# Patient Record
Sex: Male | Born: 2000 | Race: White | Hispanic: Yes | Marital: Single | Smoking: Never smoker
Health system: Southern US, Community
[De-identification: ages and names within clinical notes are randomized; demographics above are authoritative.]

## PROBLEM LIST (undated history)

## (undated) DIAGNOSIS — F840 Autistic disorder: Secondary | ICD-10-CM

## (undated) DIAGNOSIS — K529 Noninfective gastroenteritis and colitis, unspecified: Secondary | ICD-10-CM

## (undated) DIAGNOSIS — T7840XA Allergy, unspecified, initial encounter: Secondary | ICD-10-CM

## (undated) DIAGNOSIS — L309 Dermatitis, unspecified: Secondary | ICD-10-CM

## (undated) HISTORY — DX: Autistic disorder: F84.0

## (undated) HISTORY — PX: CIRCUMCISION: SHX1350

## (undated) HISTORY — PX: ESOPHAGOGASTRODUODENOSCOPY ENDOSCOPY: SHX5814

---
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
## 2021-06-04 ENCOUNTER — Encounter: Payer: Self-pay | Admitting: Psychology

## 2021-06-04 ENCOUNTER — Ambulatory Visit (INDEPENDENT_AMBULATORY_CARE_PROVIDER_SITE_OTHER): Payer: 59 | Admitting: Psychology

## 2021-06-04 DIAGNOSIS — F840 Autistic disorder: Secondary | ICD-10-CM

## 2021-06-04 NOTE — Progress Notes (Signed)
Behavioral Health Counselor Initial Adult Exam  Name: Timothy [name] Date: 06/04/2021 MRN: [id number] DOB: 07/14/2000 PCP: Cooper, Alan, MD  Time spent: 9-9:45 am  Guardian/Informant:  Maggie Surace - Mother    Paperwork requested: No   Met with patient and mother for update interview.  Patient and mother were at home and session was conducted from therapist's office via video conferencing.  Patient and mother verbally consented to telehealth.      Reason for Visit /Presenting Problem: Updated evaluation needed for continuation of support services. 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   Mental Status Exam: Appearance:   Fairly Groomed and Neat     Behavior:  Appropriate  Motor:  Restlestness  Speech/Language:   Clear and Coherent and odd vocal tone  Affect:  Flat  Mood:  euthymic  Thought process:  concrete  Thought content:    WNL  Sensory/Perceptual disturbances:    WNL  Orientation:  oriented to person, place, and time/date  Attention:  Good  Concentration:  Good  Memory:  Remote;   Poor  Fund of knowledge:   Poor  Insight:    Poor  Judgment:   Fair  Impulse Control:  Fair   Developmental History: Little change in development since the previous evaluation.   Developmental history was reported to be significant for early delays.  There were no birth complications or early physical delays.  Timothy [name] was delayed with speech and diagnosed with Autism at 20 months of age.  He communicated via sign language and PECS until he received an iPad for augmentative communication.  Gross motor skills are currently adequately developed.  Although Timothy [name] exercises and takes Tae Kwon Do lessons, he has trouble with motor planning and is physically awkward.  He doesn't play sports.  Fine motor skills are adequate overall.  Timothy [name] has trouble using his nondominant hand for most activities but plays the piano well.  Timothy [name] can speak clearly now, but his language use  is delayed.  Regarding self-care skills, Timothy [name] can do some basic cooking and laundry but needs his clothes folded.  He is able to bathe and dress himself but lacks awareness to do chores or community activities independently.  Timothy [name] lacks self-direction, safety, and advocacy skills as well as community awareness.  Socially, Timothy [name] lacks social interest and does not discriminate between friends and non-friends.  He relies on others to interact safely.  Timothy [name] is generally in a positive mood but only communicates when wants something.        Reported Symptoms:  sleeps 5-6 hours.  Takes Melatonin 10 mg. But still has trouble falling asleep and waking up early.  Will wake in the middle of the night to play electronic games.  No changes in appetite.  Mother monitors his food otherwise he would eat excessive junk food.  Support staff letting him eat too much junk food when out and he has put on some weight.  Energy is good during the day.  Exercises during the day and cooperates with all activities parents ask him to do.  No prolonged sadness or depression.  Has anxiety but no panic.  Triggered by communication difficulty and knowing what is going to be happening.  Make noises when anxious and starts scripting.  Obsessed with baby einstein and classical music (fixated on these).  Compulsively watches the same videos repeatedly.  Difficulty paying attention, easily distracted, frequent losing and forgetting, poor organization - parents handle all of his organizational needs.  Constant restlessness   with frequent skin picking.  No interest in interacting with peers.  Has one other peer with special needs at his dance class who also likes to talk about Timothy [name].  Can transition with prior notice but can become anxious depending upon the type of change.  Has a sensory room in his house and can access items when needed.  Sensitive to noise (wears headphones for this).       Risk Assessment: Danger to Self:   No Self-injurious Behavior: Yes.  without intent/plan skin picking when anxious. Danger to Others: No Duty to Warn:no Physical Aggression / Violence:No  Access to Firearms a concern: No  Gang Involvement:No  Patient / guardian was educated about steps to take if suicide or homicide risk level increases between visits: n/a While future psychiatric events cannot be accurately predicted, the patient does not currently require acute inpatient psychiatric care and does not currently meet [facility] involuntary commitment criteria.  Substance Abuse History: Current substance abuse: No     Past Psychiatric History:   Previous psychological history is significant for autism and intellectual disability. Outpatient Providers:None - mother primary provider  History of Psych Hospitalization: No  Psychological Testing: Autism Spectrum:  ADOS-2 Most recently evaluated by this provider during April 2022.  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  Abuse History:  Victim of: No.,  None    Report needed: No. Victim of Neglect:No. Perpetrator of  None   Witness / Exposure to Domestic Violence: No   Protective Services Involvement: No  Witness to [employer] Violence:  No   Family History:  Family History  Problem Relation Age of Onset   Migraines Mother    Autism Brother    ADD / ADHD Brother    Depression Brother    Anxiety disorder Brother    Tourette syndrome Brother    Sleep disorder Brother    Bipolar disorder Cousin     Living situation: the patient lives with their family (parents and brother - 6).  Brother will be graduating high school this month and leaving for college in the fall. Does seek out to engage family members.  Content to be in room alone with iPad or in sensory room.  Relies on parents for community exposure.    Sexual Orientation: Asexual - no interest in social or intimate relationships.  Relationship Status: single  Name of spouse / other:None If a  parent, number of children / ages:None  Support Systems: parents [employer] waiver, autism society - one assistant for three days per week. Would receive more if not short staff.   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    Military Service: No   Educational History: Education:  Home-school - will stay in school until 22.  Can read close to grade level but comprehension at 1st - 2nd grade.  Spells well and math is 5th -6th grade level.    Religion/Sprituality/World View: Jewish  Any cultural differences that may affect / interfere with treatment:  None  Recreation/Hobbies: Watching videos ([name], baby einstein) and listening to classical music.   Stressors: Other: When not able to go to a place or event that he wants.      Strengths: Likes to please others, puts in good effort,, persistence.  Barriers:  Access to services - staff shortages.     Legal History: Pending legal issue / charges: The patient has no significant history of legal issues. History of legal issue / charges:  None  Medical History/Surgical History: reviewed Past Medical History:  Diagnosis Date   Allergy    seasonal    Autism    Eczema    Inflammatory bowel disease    Chronic Constipation    Past Surgical History:  Procedure Laterality Date   CIRCUMCISION     ESOPHAGOGASTRODUODENOSCOPY ENDOSCOPY     Performed at [facility]/A 05/06/2016   Procedure: EXTRACTION OF IMPACTED 3RD MOLARS, FOUR FIRST BICUSPIDS;  Surgeon: Jannette Fogo, DDS;  Location: [facility];  Service: Oral Surgery;  Laterality: N/A;    Medications: Current Outpatient Medications  Medication Sig Dispense Refill   amoxicillin (AMOXIL) 500 MG capsule Take 1 capsule (500 mg total) by mouth 3 (three)  times daily. 21 capsule 0   cholecalciferol (VITAMIN D) 1000 units tablet Take 1,000 Units by mouth daily.     Cholecalciferol (VITAMIN D) 2000 units CAPS Take by mouth.     fluticasone (FLONASE) 50 MCG/ACT nasal spray Place 2 sprays into the nose daily as needed. Reported on 01/25/2015     HYDROcodone-acetaminophen (NORCO) 5-325 MG tablet Take 1 tablet by mouth every 6 (six) hours as needed for moderate pain. 30 tablet 0   hydrocortisone 2.5 % cream Apply 1 application topically 2 (two) times daily as needed. Reported on 01/25/2015     Lactobacillus-Inulin ([place]) CHEW Chew by mouth. Reported on 01/25/2015     loratadine (RA LORATADINE) 10 MG dissolvable tablet Take 10 mg by mouth daily as needed. Reported on 01/25/2015     Melatonin 5 MG CHEW Chew 10 mg by mouth at bedtime.     Phosphatidylserine-DHA-EPA (VAYARIN PO) Take 2 tablets by mouth daily.     triamcinolone cream (KENALOG) 0.1 % Reported on 01/25/2015     No current facility-administered medications for this visit.    Allergies  Allergen Reactions   Other     Seasonal Allergies, tomato, potato, nuts   No concussions seizures or HI.  Diagnoses:  Autism spectrum disorder  Plan of Care: Patient presents with severe impairment in cognitive, adaptive, and socialization skills with significant restricted repetitive behavior.  Patient was previously diagnosed with Autism spectrum disorder and intellectual disability and needs updated evaluation in order to receive adult community support services.  Testing recommended to update current functioning.      Test Battery - In person WAIS-IV, BRIEF-A Informant, [place] 3, ADOS-2 Module 4, SRS-2-O    [name], PhD

## 2021-06-04 NOTE — Progress Notes (Signed)
                Merrie Epler, PhD 

## 2021-06-12 ENCOUNTER — Ambulatory Visit (INDEPENDENT_AMBULATORY_CARE_PROVIDER_SITE_OTHER): Payer: 59 | Admitting: Psychology

## 2021-06-12 ENCOUNTER — Encounter: Payer: Self-pay | Admitting: Psychology

## 2021-06-12 DIAGNOSIS — F840 Autistic disorder: Secondary | ICD-10-CM

## 2021-06-12 NOTE — Progress Notes (Signed)
[facility] Testing Progress Note  Patient ID: Timothy [name], MRN: [id number],    Date: 06/12/2021  Time Spent: 12:00 - 3:00pm   Treatment Type: Testing  Met with patient and mother for testing session.  Patient and mother were at the clinic and session was conducted from therapist's office in person.  Reported Symptoms: Reason for Visit /Presenting Problem: Patient presents with severe impairment in cognitive, adaptive, and socialization skills with significant restricted repetitive behavior.  Patient was previously diagnosed with Autism spectrum disorder and intellectual disability and needs updated evaluation in order to receive adult community support services.  Testing recommended to update current functioning.        Mental Status Exam: Appearance:  Neat and Adequately Groomed     Behavior: Cooperative and echolalia  Motor: Rocking and odd arm movement  Speech/Language:  Repetitive and odd with atypical inflection  Affect: Flat  Mood: Euthymic with intermittent anxiety  Thought process: Limited other than rote answers  Thought content:   WNL  Sensory/Perceptual disturbances:   WNL  Orientation: oriented to person, place, time/date,   Attention: Fair  Concentration: Fair  Memory: [place] of knowledge:  Fair  Insight:   Poor  Judgment:  Fair  Impulse Control: Good   Risk Assessment: Danger to Self:  No Self-injurious Behavior: No Danger to Others: No  Behavior Observations: Patient was cooperative and displayed adequate effort. Attention and concentration were inconsistent overall, as patient sometimes needed instructions to be repeated,  occasionally solved problems correctly after the standardized time limit and missed several relatively easy problems or questions.  Mood was euthymic with intermittent anxiety and flat affect.  Patient did not initiate interaction or requests and struggled to answer questions requiring insight or inferential thinking.   The results appear representative of current functioning.    Subjective: Testing included the WAIS-IV (1.25 hrs. for testing and scoring) along with the ADOS-2 Module 4 (1.25 hrs.).  Mother completed the [place] 3 and SRS-2 (0.5 hrs. For scoring).     Diagnosis:Autism spectrum disorder  Plan: Testing complete. Report writing to be conducted followed by  interactive feedback next session.    [name], PhD

## 2021-06-12 NOTE — Progress Notes (Signed)
                Haze Antillon, PhD 

## 2021-07-04 ENCOUNTER — Encounter: Payer: Self-pay | Admitting: Psychology

## 2021-07-14 ENCOUNTER — Ambulatory Visit: Payer: 59 | Admitting: Psychology

## 2021-09-19 ENCOUNTER — Ambulatory Visit (INDEPENDENT_AMBULATORY_CARE_PROVIDER_SITE_OTHER): Payer: 59 | Admitting: Psychology

## 2021-09-19 ENCOUNTER — Encounter: Payer: Self-pay | Admitting: Psychology

## 2021-09-19 DIAGNOSIS — F840 Autistic disorder: Secondary | ICD-10-CM | POA: Diagnosis not present

## 2021-09-19 NOTE — Progress Notes (Signed)
[facility] Counselor/Therapist Progress Note  Patient ID: JAKAIDEN FILL, MRN: [id number],    Date: 09/19/2021  Time Spent: 8-8:45 am   Treatment Type:  Testing - Feedback Session  Met with mother to review results of testing.  Mother was at home and session was conducted from therapist's office via video conferencing.  Mother verbally consented to telehealth.       Reported Symptoms: Patient presents with severe impairment in cognitive, adaptive, and socialization skills with significant restricted repetitive behavior.  Patient was previously diagnosed with Autism spectrum disorder and intellectual disability and needs updated evaluation in order to receive adult community support services.  Testing recommended to update current functioning.   Subjective: Interactive feedback was conducted (1 hr.).  It was discussed how patient continued to meet the criterion for Autism Spectrum Disorder along with how this conditions affect his ability to act independently and relate to others.      Recommendations included discussing results with PCP, developing a visual organization system, and continuing current community supports.  Mother expressed agreement with the results and recommendations.     Total Time of Testing: 6 hrs. Testing and Scoring: 3 hrs. Interactive Feedback:1 hr. Report Writing: 2 hrs.   Diagnosis:Autism spectrum disorder  Plan: Report to be sent to parent and referring provider.      [name], PhD

## 2021-09-19 NOTE — Progress Notes (Signed)
Psychological Testing Report - Confidential  Identifying Information:                Patient's Name:   Timothy [name]  Date of Birth:              Jul 11, 2000     Age:     21 years             MRN#                                    [id number]             Dates of Testing:               June 12, 2021    Psychiatric/Psychological Consult Reply:  The limits of confidentiality were discussed with Timothy [name] and his mother Timothy [name].  Sang verbally indicated his assent, and his mother indicated her understanding and agreement with these limits based on her signature on the Limits of Confidentiality Statement.   Purpose of Evaluation:  Timothy [name] was a 21 year old Caucasian male.  The purpose of the evaluation is to provide diagnostic information and treatment recommendations.     Relevant Background Information: Timothy [name] was referred for psychological testing by Timothy Housekeeper, MD for re-evaluation related to Autism Spectrum Disorder (ASD).  Timothy [name] has been previously diagnosed with ASD an update is needed to continue adult community supports/services he is currently receiving.  According to [facility] mother, Timothy [name] is not able to work, drive, Timothy [name], and use public transportation independently.  He can answer questions correctly when asked about self-care but not able to do what he says without guidance.  Timothy [name] recently eloped when he was visiting an unfamiliar college campus with his family, despite telling his family he understood the importance of staying together.  He lacks self-direction, safety skills, and community awareness.     Developmental history was reported to be significant for early delays.  There were no birth complications or early physical delays.  Timothy [name] was delayed with speech and diagnosed with Autism at 65 months of age.  He communicated via sign language and PECS until he received an iPad for augmentative communication.  Gross motor skills are  currently adequately developed.  Although Timothy [name] exercises and takes Timothy [name], he has trouble with motor planning and is physically awkward.  He doesn't play sports.  Fine motor skills are adequate overall.  [name] has trouble using his nondominant hand for most activities but plays the piano well.  Timothy [name] can speak clearly now, but his language use is delayed.  Regarding self-care skills, Timothy [name] can do some basic cooking and laundry but needs his clothes folded.  He is able to bathe and dress himself but lacks awareness to do chores or community activities independently.  Timothy [name] lacks self-direction, safety, and advocacy skills as well as community awareness.  Socially, Timothy [name] lacks social interest and does not discriminate between friends and non-friends.  He relies on others to interact safely.  Timothy [name] is generally in a positive mood but only communicates when wants something.                 Medical history was reported to be significant for Seasonal Allergy , Autism, Eczema, Inflammatory bowel disease, and Chronic Constipation.  Additional allergies include tomatoes, potatoes, and nuts.  Past Surgical History includes circumcision, esophagogastroduodenoscopy endoscopy, and tooth extraction.  Current  Medications Include amoxicillin (Amoxil) 500 mg, cholecalciferol (vitamin d) 2000 units, fluticasone (Flonase) 50 mcg/act nasal spray, hydrocodone-acetaminophen (Norco), hydrocortisone 2.5 % cream, lactobacillus-inulin (Culturelle digestive health) chew, loratadine (Loratadine) 10 mg., melatonin 5 mg.  phosphatidylserine-DHA-EPA (Vayarin po), and triamcinolone cream (Kenalog) 0.1 %.  Psychotropic medications were previously tried for anxiety with little benefit. Timothy [name] was most recently evaluated by this provider during April 2022 and diagnosed with ASD, borderline IQ, severe adaptive delays, and language impairment.  He has not participated in formal behavior or ABA Therapy, but his mother  works with him at home regarding behavior management and skill training.      Educationally, Timothy [name] is currently homeschooled in the 12th grade.  He will stay in school until age 58.  Timothy [name] can read close to grade level, but comprehension is at the 1st - 2nd grade level.  He spells well and Math is at the 5th -6th grade level.  All skills are delayed but Timothy [name] enjoys music.  He likes to make others happy and will work for praise/positive attention.   Occupationally, Timothy [name] is supported by family and friends. 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   Regarding leisure activities, Timothy [name] reported enjoying watching videos (Timothy [name]) and listening to classical music.  Timothy [name] needs constant direct supervision to do home and community activities safely.   Timothy [name] lives with his parents and brother (18 years) who also has Autism but is not as impaired as Timothy [name].  Timothy [name] doesn't interact much with his brother, but they are nice to each other.  Extended family lives in [place]. Timothy [name] will visit with them but not regularly.   Timothy [name] does not have a dating history.  Family mental health/learning history was reported to be significant for his brother with Autism, ADHD, Depression, Anxiety, Tourette's Syndrome, and a sleep disorder, as well as a cousin with Bipolar Disorder.  Recent changes/life events were denied, although Timothy [name] becomes stressed when he is not able to go to a place or event that he wants. A lack of access to services due to staff shortages has been a recent barrier for him.     Presenting Symptomology:  Timothy [name] was reported to sleep 5-6 hours per night.  He takes Melatonin but still has trouble falling asleep and waking up early.  He will wake in the middle of the night to play electronic games.  Recent changes in appetite were denied.  Timothy [name]'s mother  monitors his food otherwise he would eat excessive amounts of junk food.  She reported that Raphel's support staff is letting him eat too much junk food when out in the community and he has put on some weight.  Energy was reported to be good during the day.  Cohl exercises during the day and cooperates with all activities his parents ask him to do.  Oluwafemi denied episodes of prolonged sadness or depression.  He has anxiety but no panic.  The anxiety is triggered by communication difficulty and not knowing what is going to be happening.  He makes noises when anxious and starts scripting.  Timothy [name] was reported to be obsessed with watching [employer] and listening to classical music.  He compulsively watches the same videos repeatedly.  Jamez was reported to have difficulty paying attention, being easily distracted, and frequently losing items or forgetting important information. He has  poor organization, and his parents handle all his organizational needs.  He exhibits constant restlessness with frequent skin picking.  Jaydien exhibits no interest in interacting with peers.  He has one other peer with special needs at his dance class who also likes to talk about Patrice Paradise.  He can transition with prior notice but can become anxious depending upon the type of change.  Timothy [name] has a sensory room in his house and can access items when needed.  he is overly sensitive to noise and wears headphones to limit surrounding sounds.       Procedures Administered: Wechsler Adult 7  20 Client: Julie Paolini           DOB:  [birth year]          MRN# [id number] [facility] [address]                                                       [place], [place], [address] Intelligence Scale - IV  Vineland Adaptive Behavior Scales 3 - Parent/Caregiver Report 7  20 Client: Shoua Ressler           DOB:  [birth year]          MRN# [id number] [facility] [address]                                                       [place], [place], [address] Autism Diagnostic Observation Schedule 2 Module 4 7  20  Client: Gergory Biello           DOB:  Jan 08, 2001          MRN# 09/05/2001 [facility] [address]                                                       [place], [place], [place] Social7  20 Client: Jakeob Tullis           DOB:  2000-07-27          MRN# 09/05/2001 [facility] [address]                                                       [place], [place], [place]  Responsiveness Scale - 2 - Adult Other Report  Behavioral Observations:  Delphin participated in the evaluation in person and was not medicated for this evaluation.  He was cooperative and displayed adequate effort. Attention and concentration were inconsistent overall, as patient sometimes needed instructions to be repeated, occasionally solved problems correctly after the standardized time limit and missed several relatively easy problems or questions.  Mood was euthymic with intermittent anxiety and flat affect.  Kolten did not initiate interaction or requests and struggled to answer questions requiring insight or inferential thinking.  The results appear  representative of current functioning.  Norrin was oriented to person, time, and place.  He had inconsistent overall alertness and attention.  Memory was below typical for recent, remote, working, immediate, and delayed memory.  Judgment was fair with poor insight and overly literal thinking.  Hallucinations, delusions, and dangerous ideation were denied.  Test Results and Interpretation:    Intellectual Functioning:  The WAIS-IV was used to assess Sabastien' performance across four areas of cognitive ability. When interpreting his scores, it is important to view the results as a snapshot of his current intellectual functioning. As measured by the WAIS-IV, his overall FSIQ score fell in the Low  Average range when compared to others his age (FSIQ = 52). He struggled with language processing and auditory memory, as his verbal comprehension (VCI = 78) and working memory (WMI = 74) was low while perceptual reasoning (PRI = 92) was average and processing speed (PSI = 111) was high average.  This represents a substantial increase in processing speed and decrease in working memory form the previous evaluations.  Subtest analysis indicated great disparities among skills.  Fount demonstrated average to above average functioning with performing simple activities quickly such as matching and copying while struggling the most with inferential thinking including vocabulary, general insight, and attention to visual detail.   Wechsler Adult Intelligence Scale - IV Composite Score Summary  Composite  Sum of Scaled Scores Composite Score Percentile Rank 90% Confidence Interval Qualitative Description  Verbal Comprehension VCI 19 78   7 74-84 Low  Perceptual Reasoning PRI 26 92 30 87-98 Average  Working Memory WMI 11 74   4 70-81 Low   Processing Speed PSI 24       111 77 103-117 High Average  Full Scale IQ FSIQ 80 86 18 87-94 Low Average   Domain Subtest Name  Total Raw Score Scaled Score Percentile Rank  Verbal Similarities SI 17   7 16   Comprehension Vocabulary VC 12   5   5    Information IN   8   7 16    Comprehension CO   4   2      0.5  Perceptual Reas. Block Design BD 36   8 25   Matrix Reasoning MR 17   9 37   Visual Puzzles VP 14   9 37   Picture Completion PC   8   6   9   Working Memory Digit Span DS 23   7 16    Arithmetic AR   6   4   2   Processing Speed Symbol Search SS 44 14 91   Coding CD 71 10 50   Adaptive Behavior: The Vineland-3 is a standardized measure of adaptive behavior--the things that people do to function in their everyday lives. Whereas ability measures focus on what the examinee can do in a testing situation, the Vineland-3 focuses on what they actually do in  daily life. Because it is a norm-based instrument, the examinee's adaptive functioning is compared to that of others their age.  Breyden was evaluated using the Vineland-3 Comprehensive Parent/Caregiver Form on 06/12/2021. Estella M. Fallaw, Sebastin's mother, completed the form.  Linh's overall level of adaptive functioning is described by his score on the Adaptive Behavior Composite (ABC). His ABC score is 46, which is well below the normative mean of 100 (the normative standard deviation is 15). The percentile rank for this overall score is <1.  The ABC score is based on scores for three specific adaptive behavior  domains: Communication, Daily Living Skills, and Socialization. The domain scores are also expressed as standard scores with a mean of 100 and standard deviation of 15.  The Communication domain measures how well Kristan listens and understands, expresses himself through speech, and reads and writes. His Communication standard score is 50. This corresponds to a percentile rank of <1. This domain is a relative strength for Emric.  The Daily Living Skills domain assesses Ezel's performance of the practical, everyday tasks of living that are appropriate for his age. His standard score for Daily Living Skills is 47, which corresponds to a percentile rank of <1. This domain is a relative strength for Damonta.  Raef's score for the Socialization domain reflects his functioning in social situations. His Socialization standard score is 25. The percentile rank is <1. This domain is a relative weakness for Juddson.  Vineland Adaptive Behavior Scales - 3 ABC and Domain Score Summary ABC Standard Score (SS) 90% Confidence Interval Percentile Rank SS Minus Mean SS* Strength or Weakness**  Adaptive Behavior Composite 46 44 - 48 <1    Domains       Communication 50 47 - 53 <1 9.3 Strength  Daily Living Skills 47 43 - 51 <1 6.3 Strength  Socialization 25 22 - 28 <1 -15.7 Weakness   Subdomain  Score Summary Subdomains Raw Score v-Scale Score ( vS) Age Equivalent Growth Scale Value Percent Estimated vS Minus Mean vS* Strength or Weakness**  Communication Domain         Receptive 52 3 2:0 94 0.0 -1.3 -  Expressive 69 9 2:8 86 0.0 4.7 Strength  Written 45 7 6:9 80 0.0 2.7 Strength  Daily Living Skills Domain         Personal 86 8 4:4 100 0.0 3.7 Strength  Domestic 4 1 <3:0 35 0.0 -3.3 Weakness  Community 43 6 5:9 67 0.0 1.7 Strength   Socialization Domain         Interpersonal Relationships 22 1 0:7 60 0.0 -3.3 Weakness  Play and Leisure 11 1 0:8 49 0.0 -3.3 Weakness  Coping Skills 15 3 <2:0 52 0.0 -1.3 Weakness   The Maladaptive Behavior domain provides a brief assessment of problem behaviors. The additional information it provides can prove helpful in diagnosis or intervention planning. It may also be used as a screener to determine if a more in-depth assessment of problematic behavior is warranted.  The domain includes brief scales measuring Internalizing (i.e., emotional) and Externalizing (i.e., acting-out) problems. These scales are reported using v-scale scores, which are scaled to a mean of 15 and standard deviation of 3. Higher Internalizing and Externalizing v-scale scores indicate more problem behavior. If qualitative descriptors are desired, scores of 1 to 17 may be considered Average, 18 to 20 Elevated, and 21 to 24 Clinically Significant.  Alvino received v-scale scores of 20 for Internalizing and 17 for Externalizing.  The Maladaptive Behavior domain also includes a set of Critical Items covering more severe maladaptive behaviors. Because the Critical Items do not form a unified construct, they are not scored as a scale, but instead are reported at the item level. The Critical Items for which Tadashi received a score of 2 (Often) or 1 (Sometimes) are listed below.  Gets fixated on objects or [name] of objects (Often) Harms himself (Sometimes) Uses strange or repetitive  speech (Often) Loses awareness of what is happening around him (Often) Repeats physical movements over and over (Sometimes) Has delusional beliefs (Sometimes) Gets so fixated on a topic  that it annoys others (Often) Has no response to pain (Sometimes) Wanders or darts away without regard for safety (Sometimes)  Social Emotional Functioning: Regarding symptoms of ASD, information from the ADOS 2 Module 4 indicated difficulty with several aspects of reciprocal social interaction and restricted repetitive behavior observed during this administration.  Within the area of communication, Carey primarily spoke in single words or short phrases.  There was limited variation in his tone of voice, and he often spoke in an odd tone.  There was some observation of echolalia and he frequently engaged in atypical or repetitive speech.  He needed much prompting to report routine events and he rarely offered spontaneous personal information.  Nirav showed minimal responsiveness to personal information from the examiner and did not ask any socially related questions.  Reciprocal conversation appeared minimal as Radwan either briefly answered questions or followed his own train of thought rather than participating in ana interchange.  He did not demonstrate any of descriptive, emphatic, or emotional gestures.  Socially, Phi exhibited adequately modulated eye contact.  However, his range of facial expression seemed restricted, and he did not direct any facial expression to the examiner.  His expression of enjoyment in interaction appeared minimal.  Purl exhibited minimal ability elaborating on personal emotions, but he could only identify emotions in others when prompted.  Kavion demonstrated limited insight into the nature of social relationships, including his own role in these relationships.  His awareness of responsibility seemed well below age and IQ levels.  Timothy [name] did not initiate interaction or respond to  social advances from the examiner.  Social rapport was not able to be established as Timothy [name] did not communicate other than briefly answering factual questions.  Able demonstrated limited imagination and creativity.  Regarding behavior, Timothy [name] did not demonstrate any odd sensory seeking behavior, self-injury, or compulsive behavior.  He occasionally engaged in rocking and atypical arm movement, along with repetitive noise making.     Current functioning regarding ASD related symptoms reported during daily activities was assessed using the Social Responsiveness Scale - 2.  Kylil's mother was the informant for this measure.  On this measure, Zared's T Score of 87 was in the Severe range. Scores in this range indicate deficiencies in reciprocal social behavior that are clinically significant and lead to severe and enduring interference with everyday social interactions.  Such scores are strongly associated with clinical diagnosis of an autism spectrum disorder.  Social communication and restricted repetitive behavior were rated within the severe range.                 T              Awr             Cog             Com           Mot            RRB Raw score              18               25                 43              30               30 T-score  83               81                 79              87               90  Awr = Social Awareness    Com = Social Communication Cog = Social Cognition    Mot = Social Motivation  RRB = Restricted Interests and Repetitive Behavior  DSM-5 Compatible Subscales Raw score T-score  Social Communication and Interaction  116 85  Restricted Interests and Repetitive Behavior   30 90   Summary:   Gerrit was tested in June 2022 to update current functioning.  He presents with a history of Autism and developmental delays but needs an updated evaluation to maintain adult community services and disability related supports.  Testing was recommended to  update intellectual, adaptive, and social-emotional functioning.  Test results indicated low average overall intelligence (WISC-V) with low Verbal Comprehension and Working Memory, with average Perceptual Reasoning and high average Processing Speed.  While Robertt performed typically on tasks requiring completing simple activities quickly, he demonstrated severe deficits on tasks requiring insight and inferential thinking.  Ratings of adaptive behavior (Vineland 3) indicated very low overall independent skills at a level well below intellectual ability.  Socialization, communication, and daily living skills were all rated as very low for his age with elevated internalizing behavior and several critical maladaptive behaviors identified.  Testing for Autism Spectrum Disorder (ASD) indicated severe deficits in social communication with frequent observance of restricted-repetitive behavior.  Parent ratings of behavior related to Autism Spectrum Disorder (ASD) indicated severely elevated levels of social communication and restricted repetitive behavior with extremely impaired social awareness, cognition, motivation, and communication, along with equally severe restricted repetitive behavior.  Based on test results, observations, and interview data, Gerome continues to meet the criterion for ASD, with severe adaptive behavior impairment.  Recommendations include continuing to be able to access appropriate adult community and disability supports, discussing results with medical providers, developing a visual organization system, and continued training for vocational and life skills.  See below for further recommendations.   Diagnostic Impression: DSM 5 Autism Spectrum Disorder -    Level 3 - Needs Very Extensive Support for Social Communication   Level 2 - Needs Extensive Support for Restricted Repetitive Behavior    With Severe Adaptive Behavior Impairment  Recommendations: It is recommended that Timothy [name]  continue to receive disability related services such as the [facility] [name] and Social Security Disability as Trever lacks the self-direction, safety skills, judgment, and insight necessary to work or live independently. It is recommended that Timothy [name] continue to receive adult level community supports with an emphasis on training in independent and vocational skills.  Additional recommended behavior supports for academic functioning (if needed) include: Use of visual schedule and guides during instruction. Extra time to comprehend and information before moving on to next subject. Ability to type or dictate assignments or answers rather than handwriting.  Breaking up assignments into small segments. For Timothy [name] to be more successful in his future endeavors, he may need to have more visual structure provided for his home activities.  This may include developing a visual schedule with timed reminders to complete activities and when to take regularly scheduled breaks.  The schedule and reminders could eventually be programmed into a smart-phone or tablet.  Other organizational suggestions include breaking  long-term complex activities into a series of short steps (written onto a list) and generating a prioritization list when multiple activities must be completed concurrently. This can keep Timothy [name] from becoming overwhelmed when multiple assignments are due.  Consult with medical providers regarding continued medication options for attention, impulse control, and anxiety/depressed mood.  Current medication for attention deficits is recommended to continue with consideration of medication for anxiety or depressed mood.       Mental alertness/energy can be raised by increasing exercise, improving sleep, eating a healthy diet, and managing his depression/stress.  Regarding dietary supplements, the only supplement shown to have consistent well documented positive results is the use of Omega 3 Fatty Acids.   Consult with a physician regarding any changes to physical regimen.  Derric can have success with following through on chores and self-care activities with less anxiety by breaking up activities into small manageable chunks and spreading out work assignments over longer periods of time with frequent breaks.  Developing visual supports and a system for generating and accessing reminders will help with keeping Timothy [name] on task with less prompting by others.  Listening skills can be enhanced by asking the speaker to give information in small chunks and asking for explanation and clarification.  Continue to access direct support care and structured social activity through the [employer] [place] and related local services.    Recommendations for Verbal Comprehension Skills Timothy [name] overall performance on the VCI was Low compared to other people his age. Verbal skills are an important component of academic success because classroom instruction involves listening, comprehension, verbal reasoning, and oral communication. It is therefore important to continue to build Timothy [name]'s verbal reasoning, knowledge, and comprehension skills by providing ongoing enrichment opportunities. Strategies to build verbal skills include approaches such as dialogic reading. This strategy involves asking the person specific questions about reading material to encourage interest, comprehension, and critical thinking. Verbal skills can also be enriched by exposing Timothy [name] to novel situations or materials and providing discussion about them. Others can keep a list of terms, information, and concepts that Dodge learns and periodically discuss it with him to expand Gualberto's understanding. Discovering and investigating new concepts can help him to expand his verbal reasoning, knowledge, and comprehension skills. Others can encourage Rathana to elaborate on his thoughts and expand on his contributions to the  conversation. Recommendations for Working Memory Skills Timothy [name]'s overall performance on the WMI was Low compared to others his age. Working Publishing copy can help the person ignore distractions and exert mental control. They are an important component of academic success because they help people efficiently process information in the service of learning. It is important to continue to build this area of strength for Timothy [name]. Strategies that may be useful in increasing working memory include teaching Mykel to chunk information into categories and connect new information to concepts that he already knows.   If I can be of any further assistance, please do not hesitate to call, [phone].   Salvatore Decent Quantavia Frith, Ph.D. Licensed Psychologist - [facility] [phone]   Positive Behavior Support for People with Developmental Disorders Make a Schedule - Example Time/Order Activity Picture Comment Completed  7:00am Get Ready for School - Dress, Eat, Brush Teeth  Clothes Put Homework in Folder   8:00am Go to [employer] bus Raise your hand before you speak   3:30pm Return from School and rest Bed or couch Quiet Activities only   4:00pm Start Homework Books Check spelling words  5:30pm Prepare for Dinner Table Set Table  Wash Hands   7:00pm Play/TV Time Toys TV Clean up toys when finished   8:00 Get Ready for Bed -  Pajamas, Brush Teeth, Story Bed In Bed by 8:30    Routines - A set of activities done the same way each time. Examples Morning Routine -  Wake up  Go to bathroom  Get dressed  Eat breakfast  Brush teeth  Put on shoes.              Bedtime Routine - Get undressed Put on pajamas Brush teeth Relaxation activity (story or soft music) Get in bed  Cleaning Room Routine - Put toys in toy box Put books in bookshelf Put dirty clothes in hamper Put clean clothes in drawer Make Bed     Homework Routine -  Find quiet area with desk or table Get all needed books and  papers Take out one assignment at a time Take a 5 minute break after each assignment Put completed assignments back in folder Put folder in backpack when all assignments completed  A time limit can be used for breaks instead of assignment completion.  A timer can be used. Place more enjoyable activities after the less preferred activities to reinforce participation. Smaller routines can sometimes be combined to form larger routines.   Task Analysis - Breaking activities down into a series of small steps. Cleaning Room Put toys in toy box Put dirty clothes in hamper Put books on bookshelf Make bed (Making bed can also be broken down into smaller steps)  Brushing Teeth Run water Place toothbrush under water Place toothbrush on sink Turn off water Remove toothpaste cap Squeeze toothpaste onto toothbrush Brush teeth Rinse toothbrush Fill cup with water Rinse mouth  Steps can be combined or added as needed depending upon functioning level.  Shaping - If a task or activity is too difficult and cannot be broken down any further, have the person do gradually closer approximations to the desired behavior until you get the response that you want.  Example -  Sleeping independently Sleep with other person next to bed Sleep with other person in room by door Sleep with other person just outside of door Sleep with other person in next room Sleep without other person  Communicating desire for an object Person leads you to object Person points to object Person points to picture of object Person gives picture of object to you Person vocalizes (any kind of vocalization) while giving picture to you Person makes vocalization that sounds like the correct word Person says the correct word  Scaffolding Gradually expand the person's experiences.  For example, teach new behaviors (one at a time) within the context of a familiar location, routine, and person.  When the person has practiced  and is comfortable exhibiting the new behavior in the familiar setting, then have the practice the behavior in a slightly new setting by changing either the location, routine, or person.  Later another aspect of the environment can be changed until the person is able to demonstrate the behavior comfortably in multiple settings, with multiple people, and multiple circumstances.          Visual Prompts Picture Books - Place pictures of common objects, places, people, toys, activities, etc. in a book.  The person can point to the pictures of what they want or they can take the pictures out of the book and hand them to you.  Consult with a Radiation protection practitioner  regarding the most appropriate form of communication for that person.  Picture Schedules - Attach pictures to your schedules and routine lists to help the person understand them better.  Ideas for Creating Pictures:             Website: [email or web address]             Software: Catering managerBoard Maker             Camera Phone or Digital Camera: Take pictures of common objects, places etc.  Sensory Regulation Avoid place of excess stimulation such as crowded department stores or restaurants.  Go to smaller places or during off peak hours.  If you have to go to a place that is highly stimulating, go for a short time or find a quiet place for the person to go for frequent breaks.  Ways to decrease excess stimulation: Quiet activities or soft music Firm touch such as deep massage or heavy blankets/vests Deep rhythmic breathing Separation from others Ways to increase stimulation - When a person is under stimulated you may notice more odd and repetitive behaviors.  Getting the person involved in a meaningful activity can reduce the occurrence.   Loud noise or music    Light touch Short rapid breaths Spicy foods Other activities such as exercise, art, scents, and swinging can be either stimulating or calming depending upon the person.  Check with an  [name] Therapist about specific sensory activities.   Intervention for when Person Loses Control Caregiver stays calm Person goes to quiet area or other people leave the area so it becomes quiet. Person is left alone to calm self with only monitoring from the caregiver. There should not be any intervention until the person is calm. Once the person is calm, they can be redirected to another activity, given an alternative behavior perform instead of becoming upset, or have their options explained to them so they can make an appropriate choice. The person can be taught to take 10 deep breaths to assist in calming. The teaching should be done during times when the person is calm.  They can be reminded one time to use the breathing while upset. Physical restraint should only be used if the person is hurting himself or others. For prolonged behavioral outbursts, caregivers should switch monitoring the person every 15 minutes if possible so the care givers can remain calm themselves.  Transition - Steps to help with going from one activity to another: Set a specific time for when the activity will change and inform the person ahead of time.  Make sure they know what the new activity will be and detail any actions they need to do in between such as cleaning.  Use a timer or some other concrete way of letting the person know when the current activity is finished. Give the person a brief warning about 2-3 minutes before the activity is complete so they can mentally prepare for the change. When going to a new place or activity, bringing a familiar object may help ease the person's anxiety.    Reviewing a picture or other schedule with the person prior to the activities can help can give the person advanced warning of changes. Social Stories (brief stories about social situations) can be written with the person to help them understand the concept of changes and about going from one activity to another.    Alternatives - Always give an alternative instead of just saying 'no'. When a request is denied tell the person what  they can have instead. When something is taken away, replace it with something else. If what the person wants is not available, let them know specifically when it will be available.  Use the schedule to show people when they can have what they want. Reinforcing Positive Behaviors - Let the person know when they have behaved well. Be specific about what they had done and how it was helpful.  E.g. "when you shared your toy with your sister it made her happy." Be careful about using excessive excitement, praise, or touch (E.g. pats on the back).  Many people with Autism are sensitive and may view this as aversive. Stay calm and show positive emotion when giving feedback.  Correcting Inappropriate Behaviors - Let the person know when they have behaved inappropriately and show them a more appropriate behavior.    Wait until the person is calm before applying any correction. The new behavior should help the person achieve the same outcome as the inappropriate behavior, but in a different way. The new behavior should be something the person can do.   Break the action into small steps whenever teaching a new behavior. Help the person practice the new behavior so it can eventually replace the old behavior.     Providing Consequences Consequences for appropriate and inappropriate behavior can be given under the following circumstances: Make sure the person knows and understands the consequences ahead of time.  Use pictures to demonstrate the consequences if needed.   Have the consequences be consistent with the behavior being exhibited.  Example: person hits sibling. Right Way - person apologizes, uses words or gentle touch, and performs a positive activity for sibling. Wrong Way - person is sent to their room or has toys taken away   Always follow through on the consequences once  they are stated. Provide a balance of positive and negative consequences so they person maintains their self-esteem.  Look for partial elements of positive behaviors if needed.  Salvatore Decent Demya Scruggs, Ph.D. Licensed Clinical Psychologist - [facility] [phone] Email: [email or web address]                 Bryson Dames, PhD

## 2021-09-19 NOTE — Progress Notes (Signed)
                Boen Sterbenz, PhD
